# Patient Record
Sex: Male | Born: 1970 | Race: Black or African American | Hispanic: No | Marital: Married | Smoking: Current every day smoker
Health system: Southern US, Community
[De-identification: ages and names within clinical notes are randomized; demographics above are authoritative.]

## PROBLEM LIST (undated history)

## (undated) DIAGNOSIS — I10 Essential (primary) hypertension: Secondary | ICD-10-CM

## (undated) HISTORY — PX: APPENDECTOMY: SHX54

---

## 2005-01-31 ENCOUNTER — Emergency Department (HOSPITAL_COMMUNITY): Admission: EM | Admit: 2005-01-31 | Discharge: 2005-01-31 | Payer: Self-pay | Admitting: Family Medicine

## 2005-02-02 ENCOUNTER — Emergency Department (HOSPITAL_COMMUNITY): Admission: EM | Admit: 2005-02-02 | Discharge: 2005-02-02 | Payer: Self-pay | Admitting: Family Medicine

## 2010-11-27 ENCOUNTER — Emergency Department (HOSPITAL_COMMUNITY)
Admission: EM | Admit: 2010-11-27 | Discharge: 2010-11-27 | Disposition: A | Discharge status: Other Acute Inpatient Hospital | Payer: Self-pay | Source: Home / Self Care | Admitting: Family Medicine

## 2010-11-27 ENCOUNTER — Emergency Department (HOSPITAL_COMMUNITY)
Admission: EM | Admit: 2010-11-27 | Discharge: 2010-11-27 | Payer: Self-pay | Source: Home / Self Care | Admitting: Emergency Medicine

## 2010-11-27 LAB — POCT URINALYSIS DIPSTICK
Bilirubin Urine: NEGATIVE
Hgb urine dipstick: NEGATIVE
Specific Gravity, Urine: 1.025 (ref 1.005–1.030)
Urine Glucose, Fasting: NEGATIVE mg/dL

## 2012-12-17 ENCOUNTER — Emergency Department (INDEPENDENT_AMBULATORY_CARE_PROVIDER_SITE_OTHER)
Admission: EM | Admit: 2012-12-17 | Discharge: 2012-12-17 | Disposition: A | Discharge status: Home / Self Care | Payer: BC Managed Care – PPO | Source: Home / Self Care | Attending: Family Medicine | Admitting: Family Medicine

## 2012-12-17 ENCOUNTER — Encounter (HOSPITAL_COMMUNITY): Payer: Self-pay | Admitting: Emergency Medicine

## 2012-12-17 ENCOUNTER — Other Ambulatory Visit (HOSPITAL_COMMUNITY)
Admission: RE | Admit: 2012-12-17 | Discharge: 2012-12-17 | Disposition: A | Discharge status: Home / Self Care | Payer: BC Managed Care – PPO | Source: Ambulatory Visit | Attending: Family Medicine | Admitting: Family Medicine

## 2012-12-17 DIAGNOSIS — Z113 Encounter for screening for infections with a predominantly sexual mode of transmission: Secondary | ICD-10-CM | POA: Insufficient documentation

## 2012-12-17 DIAGNOSIS — Z202 Contact with and (suspected) exposure to infections with a predominantly sexual mode of transmission: Secondary | ICD-10-CM

## 2012-12-17 HISTORY — DX: Essential (primary) hypertension: I10

## 2012-12-17 NOTE — ED Provider Notes (Signed)
History     CSN: [id number]  Arrival date & time 12/17/12  1212   First MD Initiated Contact with Patient 12/17/12 1224      Chief Complaint  Patient presents with  . Exposure to STD    (Consider location/radiation/quality/duration/timing/severity/associated sxs/prior treatment) HPI Comments: 42 year old male smoker. Comes requesting STD check. Patient stated he had unprotected sex during the last week and he is not sure about his current sexual partner having any symptoms. He "just want to make sure is not infected with an STD"  he denies dysuria or discharge from penis. No rash. No malaise. No fever or chills. Denies prior history of STDs.   Past Medical History  Diagnosis Date  . Hypertension     History reviewed. No pertinent past surgical history.  No family history on file.  History  Substance Use Topics  . Smoking status: Current Every Day Smoker  . Smokeless tobacco: Not on file  . Alcohol Use: Yes      Review of Systems  Constitutional: Negative for fever and chills.  HENT: Negative for sore throat.   Eyes: Negative for redness.  Genitourinary: Negative for dysuria, frequency, discharge, genital sores and testicular pain.  Musculoskeletal: Negative for arthralgias.  Skin: Negative for rash.    Allergies  Review of patient's allergies indicates no known allergies.  Home Medications  No current outpatient prescriptions on file.  BP 152/93  Pulse 68  Temp(Src) 97.1 F (36.2 C) (Oral)  Resp 18  SpO2 97%  Physical Exam  Vitals reviewed. Constitutional: He is oriented to person, place, and time. He appears well-developed and well-nourished. No distress.  HENT:  Head: Normocephalic and atraumatic.  Mouth/Throat: Oropharynx is clear and moist.  Eyes: Conjunctivae are normal. No scleral icterus.  Neck: Neck supple.  Cardiovascular: Normal heart sounds.   Pulmonary/Chest: Breath sounds normal.  Abdominal: Soft. He exhibits no mass. There is no  tenderness.  Genitourinary:  deferred  Lymphadenopathy:    He has no cervical adenopathy.  Neurological: He is alert and oriented to person, place, and time.  Skin: No rash noted. He is not diaphoretic.    ED Course  Procedures (including critical care time)  Labs Reviewed  URINE CYTOLOGY ANCILLARY ONLY   No results found.   1. Exposure to STD       MDM  GC, Chlamydia and Trichomonas urine test result pending at the time of discharge. Patient declined STD blood tests. Recommended consistent condom use.      Sharin Grave, MD 12/20/12 [phone]

## 2012-12-17 NOTE — ED Notes (Signed)
Pt is here for STD check up He is asymptomatic; denies any medical prob  He is alert w/no signs of acute distress.

## 2014-12-27 ENCOUNTER — Encounter (HOSPITAL_COMMUNITY): Payer: Self-pay | Admitting: Emergency Medicine

## 2014-12-27 ENCOUNTER — Emergency Department (INDEPENDENT_AMBULATORY_CARE_PROVIDER_SITE_OTHER)
Admission: EM | Admit: 2014-12-27 | Discharge: 2014-12-27 | Disposition: A | Discharge status: Home / Self Care | Payer: BLUE CROSS/BLUE SHIELD | Source: Home / Self Care | Attending: Family Medicine | Admitting: Family Medicine

## 2014-12-27 DIAGNOSIS — I10 Essential (primary) hypertension: Secondary | ICD-10-CM

## 2014-12-27 DIAGNOSIS — M7071 Other bursitis of hip, right hip: Secondary | ICD-10-CM

## 2014-12-27 MED ORDER — LISINOPRIL 20 MG PO TABS: 20.0000 mg | ORAL_TABLET | Freq: Every day | ORAL | Status: AC

## 2014-12-27 MED ORDER — DICLOFENAC SODIUM 1 % TD GEL: 4.0000 g | Freq: Four times a day (QID) | TRANSDERMAL | Status: DC

## 2014-12-27 NOTE — Discharge Instructions (Signed)
Ice and gel on hip as prescribed, take bp medicine and see your doctor in 10 days for recheck.

## 2014-12-27 NOTE — ED Provider Notes (Signed)
CSN: [id number]     Arrival date & time 12/27/14  1706 History   None    Chief Complaint  Patient presents with  . Leg Pain  . Hip Pain   (Consider location/radiation/quality/duration/timing/severity/associated sxs/prior Treatment) Patient is a 44 y.o. male presenting with hip pain. The history is provided by the patient.  Hip Pain This is a new problem. The current episode started more than 1 week ago (does a lot of sitting for prolonged time, carries wallet in right rear pocket., no radicular pain.). The problem has not changed since onset.   Past Medical History  Diagnosis Date  . Hypertension    History reviewed. No pertinent past surgical history. History reviewed. No pertinent family history. History  Substance Use Topics  . Smoking status: Current Every Day Smoker  . Smokeless tobacco: Not on file  . Alcohol Use: Yes    Review of Systems  Constitutional: Negative.   Gastrointestinal: Negative.   Musculoskeletal: Negative for myalgias, back pain, joint swelling and gait problem.  Skin: Negative.     Allergies  Review of patient's allergies indicates no known allergies.  Home Medications   Prior to Admission medications   Medication Sig Start Date End Date Taking? Authorizing Provider  diclofenac sodium (VOLTAREN) 1 % GEL Apply 4 g topically 4 (four) times daily. 12/27/14   Linna HoffJames D Kendel Pesnell, MD  lisinopril (PRINIVIL,ZESTRIL) 20 MG tablet Take 1 tablet (20 mg total) by mouth daily. 12/27/14   Linna HoffJames D Paizley Ramella, MD   BP 193/97 mmHg  Pulse 63  Temp(Src) 98 F (36.7 C) (Oral)  Resp 18  SpO2 100% Physical Exam  Constitutional: He is oriented to person, place, and time. He appears well-developed and well-nourished.  Musculoskeletal: He exhibits tenderness.       Arms: Neurological: He is alert and oriented to person, place, and time.  Skin: Skin is warm and dry.  Nursing note and vitals reviewed.   ED Course  Procedures (including critical care time) Labs  Review Labs Reviewed - No data to display  Imaging Review No results found.   MDM   1. Bursitis, hip, right   2. Essential hypertension       Linna HoffJames D Alina Gilkey, MD 12/27/14 Windell [id number]

## 2014-12-27 NOTE — ED Notes (Signed)
Pt states that his right hip and leg have been hurting for over 1.5 weeks.

## 2015-02-09 ENCOUNTER — Other Ambulatory Visit: Payer: Self-pay | Admitting: Nurse Practitioner

## 2015-02-09 DIAGNOSIS — E049 Nontoxic goiter, unspecified: Secondary | ICD-10-CM

## 2015-02-18 ENCOUNTER — Emergency Department (INDEPENDENT_AMBULATORY_CARE_PROVIDER_SITE_OTHER)
Admission: EM | Admit: 2015-02-18 | Discharge: 2015-02-18 | Disposition: A | Discharge status: Home / Self Care | Payer: BLUE CROSS/BLUE SHIELD | Source: Home / Self Care | Attending: Family Medicine | Admitting: Family Medicine

## 2015-02-18 ENCOUNTER — Emergency Department (INDEPENDENT_AMBULATORY_CARE_PROVIDER_SITE_OTHER): Payer: BLUE CROSS/BLUE SHIELD

## 2015-02-18 ENCOUNTER — Encounter (HOSPITAL_COMMUNITY): Payer: Self-pay | Admitting: *Deleted

## 2015-02-18 DIAGNOSIS — M5489 Other dorsalgia: Secondary | ICD-10-CM | POA: Diagnosis not present

## 2015-02-18 DIAGNOSIS — M549 Dorsalgia, unspecified: Secondary | ICD-10-CM

## 2015-02-18 MED ORDER — METHOCARBAMOL 500 MG PO TABS: 500.0000 mg | ORAL_TABLET | Freq: Three times a day (TID) | ORAL | Status: DC

## 2015-02-18 NOTE — ED Provider Notes (Signed)
CSN: [id number]     Arrival date & time 02/18/15  1309 History   First MD Initiated Contact with Patient 02/18/15 1321     Chief Complaint  Patient presents with  . Back Pain   (Consider location/radiation/quality/duration/timing/severity/associated sxs/prior Treatment) Patient is a 44 y.o. male presenting with back pain. The history is provided by the patient and the spouse.  Back Pain Location:  Thoracic spine Quality:  Stiffness Pain severity:  Mild Duration:  2 weeks Progression:  Unchanged Chronicity:  New Context: physical stress   Context: not recent illness and not recent injury     Past Medical History  Diagnosis Date  . Hypertension    History reviewed. No pertinent past surgical history. History reviewed. No pertinent family history. History  Substance Use Topics  . Smoking status: Current Every Day Smoker  . Smokeless tobacco: Not on file  . Alcohol Use: Yes    Review of Systems  Constitutional: Negative.   Musculoskeletal: Positive for myalgias and back pain. Negative for gait problem.    Allergies  Review of patient's allergies indicates no known allergies.  Home Medications   Prior to Admission medications   Medication Sig Start Date End Date Taking? Authorizing Provider  diclofenac sodium (VOLTAREN) 1 % GEL Apply 4 g topically 4 (four) times daily. 12/27/14   Linna HoffJames D Kindl, MD  lisinopril (PRINIVIL,ZESTRIL) 20 MG tablet Take 1 tablet (20 mg total) by mouth daily. 12/27/14   Linna HoffJames D Kindl, MD  methocarbamol (ROBAXIN) 500 MG tablet Take 1 tablet (500 mg total) by mouth 3 (three) times daily. As muscle relaxer 02/18/15   Linna HoffJames D Kindl, MD   BP 149/85 mmHg  Pulse 66  Temp(Src) 99.1 F (37.3 C) (Oral)  Resp 18  SpO2 100% Physical Exam  Constitutional: He is oriented to person, place, and time. He appears well-developed and well-nourished.  Neck: Normal range of motion. Neck supple.  Musculoskeletal: He exhibits tenderness.        Arms: Lymphadenopathy:    He has no cervical adenopathy.  Neurological: He is alert and oriented to person, place, and time.  Skin: Skin is warm and dry.  Nursing note and vitals reviewed.   ED Course  Procedures (including critical care time) Labs Review Labs Reviewed - No data to display  Imaging Review Dg Scoliosis Eval Complete Spine 4 Or 5 Views  02/18/2015   CLINICAL DATA:  Back pain.  Scoliosis.  EXAM: DG SCOLIOSIS EVAL COMPLETE SPINE 4-5V  COMPARISON:  None.  FINDINGS: Normal alignment of the thoracic and lumbar spines. No scoliosis. Disc spaces and vertebral bodies are maintained.  IMPRESSION: Normal thoracolumbar series.  No measurable scoliosis.   Electronically Signed   By: Rudie MeyerP.  Gallerani M.D.   On: 02/18/2015 13:59    X-rays reviewed and report per radiologist.  MDM   1. Midline back pain, unspecified location   2. Back pain        Linna HoffJames D Kindl, MD 02/18/15 [phone]

## 2015-02-18 NOTE — Discharge Instructions (Signed)
Heat and medicine as needed, see orthopedist if further problems °

## 2015-02-18 NOTE — ED Notes (Signed)
Pt  Reports  Upper  r   Back pain   Worse on  Movements   -  denys  Any  specefic  Injury     sitting  Upright on the  Exam table  Speaking in  Complete  sentances  Appearing in no  Severe  Distress

## 2015-04-12 ENCOUNTER — Other Ambulatory Visit (HOSPITAL_COMMUNITY): Payer: Self-pay | Admitting: Endocrinology

## 2015-04-12 DIAGNOSIS — E049 Nontoxic goiter, unspecified: Secondary | ICD-10-CM

## 2015-04-12 DIAGNOSIS — E0590 Thyrotoxicosis, unspecified without thyrotoxic crisis or storm: Secondary | ICD-10-CM

## 2015-04-26 ENCOUNTER — Ambulatory Visit (HOSPITAL_COMMUNITY): Payer: BLUE CROSS/BLUE SHIELD

## 2015-04-27 ENCOUNTER — Other Ambulatory Visit (HOSPITAL_COMMUNITY): Payer: BLUE CROSS/BLUE SHIELD

## 2015-05-03 ENCOUNTER — Ambulatory Visit (HOSPITAL_COMMUNITY)
Admission: RE | Admit: 2015-05-03 | Discharge: 2015-05-03 | Disposition: A | Discharge status: Home / Self Care | Payer: BLUE CROSS/BLUE SHIELD | Source: Ambulatory Visit | Attending: Endocrinology | Admitting: Endocrinology

## 2015-05-03 DIAGNOSIS — E049 Nontoxic goiter, unspecified: Secondary | ICD-10-CM

## 2015-05-03 DIAGNOSIS — E0500 Thyrotoxicosis with diffuse goiter without thyrotoxic crisis or storm: Secondary | ICD-10-CM | POA: Diagnosis present

## 2015-05-03 DIAGNOSIS — E0590 Thyrotoxicosis, unspecified without thyrotoxic crisis or storm: Secondary | ICD-10-CM

## 2015-05-04 ENCOUNTER — Encounter (HOSPITAL_COMMUNITY)
Admission: RE | Admit: 2015-05-04 | Discharge: 2015-05-04 | Disposition: A | Discharge status: Home / Self Care | Payer: BLUE CROSS/BLUE SHIELD | Source: Ambulatory Visit | Attending: Endocrinology | Admitting: Endocrinology

## 2015-05-04 DIAGNOSIS — E0500 Thyrotoxicosis with diffuse goiter without thyrotoxic crisis or storm: Secondary | ICD-10-CM | POA: Diagnosis not present

## 2015-05-04 MED ORDER — SODIUM PERTECHNETATE TC 99M INJECTION: 10.9000 | Freq: Once | INTRAVENOUS | Status: AC | PRN

## 2015-05-04 MED ORDER — SODIUM IODIDE I 131 CAPSULE: 6.5000 | Freq: Once | INTRAVENOUS | Status: AC | PRN

## 2020-03-18 ENCOUNTER — Other Ambulatory Visit: Payer: Self-pay

## 2020-03-18 ENCOUNTER — Ambulatory Visit
Admission: EM | Admit: 2020-03-18 | Discharge: 2020-03-18 | Disposition: A | Discharge status: Home / Self Care | Payer: Managed Care, Other (non HMO) | Attending: Physician Assistant | Admitting: Physician Assistant

## 2020-03-18 DIAGNOSIS — K5900 Constipation, unspecified: Secondary | ICD-10-CM

## 2020-03-18 DIAGNOSIS — R1031 Right lower quadrant pain: Secondary | ICD-10-CM

## 2020-03-18 DIAGNOSIS — Z9049 Acquired absence of other specified parts of digestive tract: Secondary | ICD-10-CM

## 2020-03-18 MED ORDER — POLYETHYLENE GLYCOL 3350 17 G PO PACK: 17.0000 g | PACK | Freq: Every day | ORAL | 0 refills | Status: AC

## 2020-03-18 NOTE — Discharge Instructions (Signed)
No alarming signs on exam. Start miralax as directed. Keep hydrated, urine should be clear to pale yellow in color. Follow up with PCP if symptoms not improving. If having worsening abdominal pain, nausea/vomiting, not passing gas, go to the ED for further evaluation.

## 2020-03-18 NOTE — ED Provider Notes (Signed)
[facility]    CSN: [id number] Arrival date & time: 03/18/20  0909      History   Chief Complaint Chief Complaint  Patient presents with  . Abdominal Pain    HPI Clinton Walter is a 49 y.o. male.   49 year old male comes in for 2 day history of RLQ pain. Pain is intermittent, states feels constipated, with pressure sensation. Pain depending on certain movement. Denies nausea, vomiting. Denies fever, chills, body aches. Denies urinary symptoms. Denies testicular swelling/pain. Last BM yesterday without much straining, but with small hard stools. S/p appendectomy 1993     Past Medical History:  Diagnosis Date  . Hypertension     There are no problems to display for this patient.   Past Surgical History:  Procedure Laterality Date  . APPENDECTOMY         Home Medications    Prior to Admission medications   Medication Sig Start Date End Date Taking? Authorizing Provider  lisinopril (PRINIVIL,ZESTRIL) 20 MG tablet Take 1 tablet (20 mg total) by mouth daily. 12/27/14   Linna Hoff, MD  polyethylene glycol (MIRALAX) 17 g packet Take 17 g by mouth daily. 03/18/20   Belinda Fisher, PA-C    Family History History reviewed. No pertinent family history.  Social History Social History   Tobacco Use  . Smoking status: Current Every Day Smoker  . Smokeless tobacco: Never Used  Substance Use Topics  . Alcohol use: Yes  . Drug use: Yes    Types: Marijuana     Allergies   Patient has no known allergies.   Review of Systems Review of Systems  Reason unable to perform ROS: See HPI as above.     Physical Exam Triage Vital Signs ED Triage Vitals  Enc Vitals Group     BP 03/18/20 0915 (!) 184/97     Pulse Rate 03/18/20 0915 72     Resp 03/18/20 0915 20     Temp 03/18/20 0915 98.5 F (36.9 C)     Temp Source 03/18/20 0915 Oral     SpO2 03/18/20 0915 97 %     Weight --      Height --      Head Circumference --      Peak Flow --      Pain  Score 03/18/20 0920 5     Pain Loc --      Pain Edu? --      Excl. in GC? --    No data found.  Updated Vital Signs BP (!) 184/97 (BP Location: Left Arm)   Pulse 72   Temp 98.5 F (36.9 C) (Oral)   Resp 20   SpO2 97%   Physical Exam Constitutional:      General: He is not in acute distress.    Appearance: Normal appearance. He is not ill-appearing, toxic-appearing or diaphoretic.  HENT:     Head: Normocephalic and atraumatic.  Cardiovascular:     Rate and Rhythm: Normal rate and regular rhythm.  Pulmonary:     Effort: Pulmonary effort is normal. No respiratory distress.     Comments: LCTAB Abdominal:     General: Bowel sounds are normal.     Palpations: Abdomen is soft.     Tenderness: There is no abdominal tenderness. There is no right CVA tenderness, left CVA tenderness, guarding or rebound.     Hernia: No hernia is present.     Comments: Linear scar across RLQ from  prior appendectomy.   Musculoskeletal:     Cervical back: Normal range of motion and neck supple.  Skin:    General: Skin is warm and dry.  Neurological:     Mental Status: He is alert and oriented to person, place, and time.      UC Treatments / Results  Labs (all labs ordered are listed, but only abnormal results are displayed) Labs Reviewed - No data to display  EKG   Radiology No results found.  Procedures Procedures (including critical care time)  Medications Ordered in UC Medications - No data to display  Initial Impression / Assessment and Plan / UC Course  I have reviewed the triage vital signs and the nursing notes.  Pertinent labs & imaging results that were available during my care of the patient were reviewed by me and considered in my medical decision making (see chart for details).    No alarming signs on exam. Will treat for constipation that is possibly causing symptoms. miralax as directed. Return precautions given. Patient expresses understanding and agrees to plan.   Final Clinical Impressions(s) / UC Diagnoses   Final diagnoses:  RLQ abdominal pain  Constipation, unspecified constipation type  S/P appendectomy    ED Prescriptions    Medication Sig Dispense Auth. Provider   polyethylene glycol (MIRALAX) 17 g packet Take 17 g by mouth daily. 14 each Ok Edwards, PA-C     PDMP not reviewed this encounter.   Ok Edwards, PA-C 03/18/20 1112

## 2020-03-18 NOTE — ED Triage Notes (Signed)
Clinton Walter is here with abdominal pain that he states started yesterday shortly after waking up and has continued off/on. He notes pain in the lower right abdomen (he has had an appendectomy).

## 2021-11-15 ENCOUNTER — Encounter (HOSPITAL_BASED_OUTPATIENT_CLINIC_OR_DEPARTMENT_OTHER): Payer: Self-pay | Admitting: Emergency Medicine

## 2021-11-15 ENCOUNTER — Emergency Department (HOSPITAL_BASED_OUTPATIENT_CLINIC_OR_DEPARTMENT_OTHER)
Admission: EM | Admit: 2021-11-15 | Discharge: 2021-11-15 | Disposition: A | Discharge status: Home / Self Care | Payer: Managed Care, Other (non HMO) | Attending: Emergency Medicine | Admitting: Emergency Medicine

## 2021-11-15 ENCOUNTER — Other Ambulatory Visit: Payer: Self-pay

## 2021-11-15 ENCOUNTER — Ambulatory Visit
Admission: EM | Admit: 2021-11-15 | Discharge: 2021-11-15 | Disposition: A | Discharge status: Home / Self Care | Payer: Managed Care, Other (non HMO)

## 2021-11-15 ENCOUNTER — Emergency Department (HOSPITAL_BASED_OUTPATIENT_CLINIC_OR_DEPARTMENT_OTHER): Payer: Managed Care, Other (non HMO) | Admitting: Radiology

## 2021-11-15 DIAGNOSIS — I161 Hypertensive emergency: Secondary | ICD-10-CM | POA: Diagnosis not present

## 2021-11-15 DIAGNOSIS — M25512 Pain in left shoulder: Secondary | ICD-10-CM

## 2021-11-15 DIAGNOSIS — X500XXA Overexertion from strenuous movement or load, initial encounter: Secondary | ICD-10-CM | POA: Diagnosis not present

## 2021-11-15 DIAGNOSIS — S299XXA Unspecified injury of thorax, initial encounter: Secondary | ICD-10-CM | POA: Diagnosis present

## 2021-11-15 DIAGNOSIS — S29011A Strain of muscle and tendon of front wall of thorax, initial encounter: Secondary | ICD-10-CM | POA: Insufficient documentation

## 2021-11-15 DIAGNOSIS — I10 Essential (primary) hypertension: Secondary | ICD-10-CM | POA: Diagnosis not present

## 2021-11-15 DIAGNOSIS — Z79899 Other long term (current) drug therapy: Secondary | ICD-10-CM | POA: Insufficient documentation

## 2021-11-15 DIAGNOSIS — M25571 Pain in right ankle and joints of right foot: Secondary | ICD-10-CM

## 2021-11-15 DIAGNOSIS — M24811 Other specific joint derangements of right shoulder, not elsewhere classified: Secondary | ICD-10-CM | POA: Diagnosis not present

## 2021-11-15 DIAGNOSIS — M25572 Pain in left ankle and joints of left foot: Secondary | ICD-10-CM

## 2021-11-15 DIAGNOSIS — M25511 Pain in right shoulder: Secondary | ICD-10-CM

## 2021-11-15 LAB — URINALYSIS, ROUTINE W REFLEX MICROSCOPIC
Bilirubin Urine: NEGATIVE
Glucose, UA: NEGATIVE mg/dL
Hgb urine dipstick: NEGATIVE
Ketones, ur: NEGATIVE mg/dL
Leukocytes,Ua: NEGATIVE
Nitrite: NEGATIVE
Protein, ur: 30 mg/dL — AB
Specific Gravity, Urine: 1.025 (ref 1.005–1.030)
pH: 5.5 (ref 5.0–8.0)

## 2021-11-15 LAB — CBC
HCT: 38.6 % — ABNORMAL LOW (ref 39.0–52.0)
Hemoglobin: 11.9 g/dL — ABNORMAL LOW (ref 13.0–17.0)
MCH: 25 pg — ABNORMAL LOW (ref 26.0–34.0)
MCHC: 30.8 g/dL (ref 30.0–36.0)
MCV: 81.1 fL (ref 80.0–100.0)
Platelets: 254 10*3/uL (ref 150–400)
RBC: 4.76 MIL/uL (ref 4.22–5.81)
RDW: 15.9 % — ABNORMAL HIGH (ref 11.5–15.5)
WBC: 8.6 10*3/uL (ref 4.0–10.5)
nRBC: 0 % (ref 0.0–0.2)

## 2021-11-15 LAB — BASIC METABOLIC PANEL
Anion gap: 6 (ref 5–15)
BUN: 12 mg/dL (ref 6–20)
CO2: 28 mmol/L (ref 22–32)
Calcium: 9.5 mg/dL (ref 8.9–10.3)
Chloride: 103 mmol/L (ref 98–111)
Creatinine, Ser: 0.9 mg/dL (ref 0.61–1.24)
GFR, Estimated: >60 mL/min (ref >60)
Glucose, Bld: 108 mg/dL — ABNORMAL HIGH (ref 70–99)
Potassium: 3.7 mmol/L (ref 3.5–5.1)
Sodium: 137 mmol/L (ref 135–145)

## 2021-11-15 LAB — TROPONIN I (HIGH SENSITIVITY): Troponin I (High Sensitivity): 16 ng/L (ref <18)

## 2021-11-15 MED ORDER — METHYLPREDNISOLONE 4 MG PO TBPK: ORAL_TABLET | ORAL | 0 refills | Status: AC

## 2021-11-15 MED ORDER — METHOCARBAMOL 500 MG PO TABS
750.0000 mg | ORAL_TABLET | Freq: Once | ORAL | Status: AC
  Administered 2021-11-15: 750 mg via ORAL
  Filled 2021-11-15: qty 2

## 2021-11-15 MED ORDER — ACETAMINOPHEN 500 MG PO TABS
1000.0000 mg | ORAL_TABLET | Freq: Once | ORAL | Status: AC
  Administered 2021-11-15: 1000 mg via ORAL
  Filled 2021-11-15: qty 2

## 2021-11-15 MED ORDER — METHOCARBAMOL 750 MG PO TABS: 750.0000 mg | ORAL_TABLET | Freq: Four times a day (QID) | ORAL | 0 refills | Status: AC | PRN

## 2021-11-15 MED ORDER — AMLODIPINE BESYLATE 5 MG PO TABS: 5.0000 mg | ORAL_TABLET | Freq: Every day | ORAL | 0 refills | Status: DC

## 2021-11-15 NOTE — Discharge Instructions (Signed)
Please go to the hospital as soon as you leave urgent care for further evaluation and management. 

## 2021-11-15 NOTE — Discharge Instructions (Addendum)
1.  The most important thing for you to address is your blood pressure.  Your instructions in your discharge instructions regarding diet and exercise to help with blood pressure maintenance.  Also quitting smoking is very important. You have been prescribed amlodipine to start for blood pressure control.  Being started at a 5 mg daily dose.  If after 1 week of taking the medication, your blood pressures remain over 140 on your upper number, take 10 mg daily.  Is very important that you schedule follow-up with your primary care physician to monitor your blood pressure and help you achieve blood pressure control with lifestyle modification and medications. 2.  You appear to have a muscle tear.  You may also have a tear or strain in the rotator cuff or other surrounding supporting structures of the shoulder.  For your symptoms you are being prescribed a steroid pack.  Take per package instructions.  For pain control you may take extra strength Tylenol every 6 hours.  You have also been prescribed a muscle relaxer.  Start with small range of motion exercises as illustrated in the emergency department. 3.  Return to emergency department immediately if you have worsening or concerning symptoms such as headache, visual changes, chest pain, shortness of breath or other concerns

## 2021-11-15 NOTE — ED Provider Notes (Signed)
[facility]    CSN: [id number] Arrival date & time: 11/15/21  6283      History   Chief Complaint Chief Complaint  Patient presents with   shoulder pains    HPI Clinton Walter is a 51 y.o. male.   Patient presents with bilateral shoulder pains, right chest pain, bilateral ankle pain that has been present for approximately 5 days.  Patient denies any apparent injury but thinks that he may have pulled some muscles causing the pain.  Pain is worsened with movement of the shoulder.  The right shoulder pain is worse than the left.  He reports that the chest pain occurs in the right chest and radiates down the right arm.  Also having some associated numbness and tingling in his arms.  Bilateral ankle pain is intermittent and mainly occurs with movement.  Denies any numbness or tingling in the ankles.  Denies any swelling in the ankles.  Patient has elevated blood pressure reading in urgent care today.  He denies shortness of breath, headache, nausea, vomiting, dizziness, blurred vision.  He reports that he used to take lisinopril approximately 1 year ago but stopped taking after he ran out of the medication after losing his primary care doctor.    Past Medical History:  Diagnosis Date   Hypertension     There are no problems to display for this patient.   Past Surgical History:  Procedure Laterality Date   APPENDECTOMY         Home Medications    Prior to Admission medications   Medication Sig Start Date End Date Taking? Authorizing Provider  lisinopril (PRINIVIL,ZESTRIL) 20 MG tablet Take 1 tablet (20 mg total) by mouth daily. 12/27/14   Linna Hoff, MD  polyethylene glycol (MIRALAX) 17 g packet Take 17 g by mouth daily. 03/18/20   Belinda Fisher, PA-C    Family History History reviewed. No pertinent family history.  Social History Social History   Tobacco Use   Smoking status: Every Day   Smokeless tobacco: Never  Substance Use Topics   Alcohol use:  Yes   Drug use: Yes    Types: Marijuana     Allergies   Patient has no known allergies.   Review of Systems Review of Systems Per HPI  Physical Exam Triage Vital Signs ED Triage Vitals  Enc Vitals Group     BP 11/15/21 0821 (!) 208/110     Pulse Rate 11/15/21 0821 77     Resp 11/15/21 0821 18     Temp 11/15/21 0821 98.8 F (37.1 C)     Temp Source 11/15/21 0821 Oral     SpO2 11/15/21 0821 97 %     Weight --      Height --      Head Circumference --      Peak Flow --      Pain Score 11/15/21 0822 0     Pain Loc --      Pain Edu? --      Excl. in GC? --    No data found.  Updated Vital Signs BP (!) 197/85    Pulse 77    Temp 98.8 F (37.1 C) (Oral)    Resp 18    SpO2 97%   Visual Acuity Right Eye Distance:   Left Eye Distance:   Bilateral Distance:    Right Eye Near:   Left Eye Near:    Bilateral Near:     Physical Exam  Constitutional:      General: He is not in acute distress.    Appearance: Normal appearance. He is not toxic-appearing or diaphoretic.  HENT:     Head: Normocephalic and atraumatic.  Eyes:     Extraocular Movements: Extraocular movements intact.     Conjunctiva/sclera: Conjunctivae normal.  Cardiovascular:     Rate and Rhythm: Normal rate and regular rhythm.     Pulses: Normal pulses.     Heart sounds: Normal heart sounds.  Pulmonary:     Effort: Pulmonary effort is normal. No respiratory distress.     Breath sounds: Normal breath sounds.  Musculoskeletal:     Comments: No tenderness to palpation to entirety of chest or bilateral shoulders.  Pain does occur with range of motion of right shoulder to anterior shoulder.  Neurovascular intact to bilateral upper extremities.  Grip strength 5/5.  No tenderness to palpation to bilateral ankles.  No obvious swelling noted.  No erythema noted.  Neurovascular intact to bilateral lower extremities.  Full range of motion present.  Neurological:     General: No focal deficit present.     Mental  Status: He is alert and oriented to person, place, and time. Mental status is at baseline.     Cranial Nerves: Cranial nerves 2-12 are intact.     Sensory: Sensation is intact.     Motor: Motor function is intact.     Coordination: Coordination is intact.     Gait: Gait is intact.  Psychiatric:        Mood and Affect: Mood normal.        Behavior: Behavior normal.        Thought Content: Thought content normal.        Judgment: Judgment normal.     UC Treatments / Results  Labs (all labs ordered are listed, but only abnormal results are displayed) Labs Reviewed - No data to display  EKG   Radiology No results found.  Procedures Procedures (including critical care time)  Medications Ordered in UC Medications - No data to display  Initial Impression / Assessment and Plan / UC Course  I have reviewed the triage vital signs and the nursing notes.  Pertinent labs & imaging results that were available during my care of the patient were reviewed by me and considered in my medical decision making (see chart for details).     Patient has bilateral shoulder pain, right chest pain, bilateral ankle pain that appears musculoskeletal in nature.  Discussed supportive care and RICE with patient.  Do not think that imaging is necessary at this time due to physical exam and no apparent injury.  Patient has a significantly elevated blood pressure in urgent care today warranting more extensive evaluation than can be provided at the urgent care.  It also appears the patient will need to get control of his blood pressure today and I do not have any medications in urgent care to do this.  I also do not think that a prescription for blood pressure medication will obtain the decrease in blood pressure that would be needed due to how high the blood pressure is at this time.  Advised patient that he will need to go the hospital for further evaluation and management of high blood pressure.  Patient  verbalized understanding and was agreeable with plan.  Patient left via his family member transporting him to the hospital. Final Clinical Impressions(s) / UC Diagnoses   Final diagnoses:  Hypertensive emergency  Acute  pain of both shoulders  Acute bilateral ankle pain     Discharge Instructions      Please go to the hospital as soon as you leave urgent care for further evaluation and management.    ED Prescriptions   None    PDMP not reviewed this encounter.   Gustavus BryantMound, Cythia Bachtel E, [place]FNP 11/15/21 [phone]

## 2021-11-15 NOTE — ED Triage Notes (Signed)
Pt c/o not being able to lift arms states "it's real sore," also ankles are sore, and right chest is sore when lifting arms. Onset ~ Monday. States was "doing some heavy lifting."

## 2021-11-15 NOTE — ED Notes (Signed)
Patient transported to X-ray 

## 2021-11-15 NOTE — ED Triage Notes (Signed)
Pt seen at [facility] for right shoulder pain x3 days after lifting something heavy. Sent here for HTN. States he has not been taking HTN meds for a long time. Denies HA or blurred vision.

## 2021-11-15 NOTE — ED Provider Notes (Signed)
[facility] EMERGENCY DEPT Provider Note   CSN: [id number] Arrival date & time: 11/15/21  1448     History  Chief Complaint  Patient presents with   Hypertension    Clinton Walter is a 51 y.o. male.  HPI Patient reports he was lifting down a heavy bin on a shelf.  He reports when he pulled it off it turned out to be much heavier than he expected and he had to compensate to hold it with his right arm.  He reports at the time it was not exquisitely painful but by the next morning he had a lot of pain and stiffness in the right shoulder and in the pectoralis.  Since that time, pain has been progressive and he is having more difficulty if he raises his arm with severe pain into the top of the shoulder.  He reports in certain positions also he gets a sharp pain right on the far lateral side of the right chest.  No associated shortness of breath nausea lightheadedness or near syncope.  Patient has been getting relief of pain with combination of acetaminophen and ibuprofen.  Patient does not get any exertional chest pain or shortness of breath with exertion  Patient was seen in urgent care today and concern for hypertensive emergency based on elevated blood pressure plus chest and shoulder pain.  Patient reports he has not been taking Lotrel consistently for over a year.  He has not taken any blood pressure medicine for at least several weeks.  He reports he does not take it because he feels like he gets muscle cramps all over when he takes his blood pressure medicine.  He has been diagnosed with hypertension for several years and has had intermittent compliance.  Patient denies he gets any problems with headaches or blurred vision.  He does not experience exertional chest pain or shortness of breath.  He reports he smokes marijuana.  He does not smoke cigarettes.  Intermittent social alcohol use no consistent alcohol use.    Home Medications Prior to Admission medications    Medication Sig Start Date End Date Taking? Authorizing Provider  amLODipine (NORVASC) 5 MG tablet Take 1 tablet (5 mg total) by mouth daily. 11/15/21  Yes Arby Barrette, MD  methocarbamol (ROBAXIN-750) 750 MG tablet Take 1 tablet (750 mg total) by mouth every 6 (six) hours as needed for muscle spasms. 11/15/21  Yes Arby Barrette, MD  methylPREDNISolone (MEDROL DOSEPAK) 4 MG TBPK tablet Take per Dosepak instruction 11/15/21  Yes Alajah Witman, Lebron Conners, MD  lisinopril (PRINIVIL,ZESTRIL) 20 MG tablet Take 1 tablet (20 mg total) by mouth daily. 12/27/14   Linna Hoff, MD  polyethylene glycol (MIRALAX) 17 g packet Take 17 g by mouth daily. 03/18/20   Belinda Fisher, PA-C      Allergies    Patient has no known allergies.    Review of Systems   Review of Systems 10 systems reviewed negative except as per HPI Physical Exam Updated Vital Signs BP (!) 170/98    Pulse (!) 54    Temp 98.4 F (36.9 C) (Oral)    Resp (!) 21    Ht 6' (1.829 m)    Wt 108.9 kg    SpO2 99%    BMI 32.55 kg/m  Physical Exam Constitutional:      Appearance: Normal appearance.     Comments: Alert nontoxic well in appearance.  No respiratory distress.  HENT:     Mouth/Throat:     Pharynx:  Oropharynx is clear.  Eyes:     Extraocular Movements: Extraocular movements intact.  Cardiovascular:     Rate and Rhythm: Normal rate and regular rhythm.  Pulmonary:     Effort: Pulmonary effort is normal.     Breath sounds: Normal breath sounds.     Comments: Point tenderness on the far lateral pectoralis minor area.  Exacerbated by elevating the arm. Chest:     Chest wall: Tenderness present.  Abdominal:     General: There is no distension.     Palpations: Abdomen is soft.     Tenderness: There is no abdominal tenderness. There is no guarding.  Musculoskeletal:        General: No swelling.     Cervical back: Neck supple.     Right lower leg: No edema.     Left lower leg: No edema.     Comments: Significant pain with abducting  shoulder beyond about 75 degrees.  Focal pain over the point of the shoulder.  Arm is otherwise neurovascularly intact and normal.  Pulse 2+ and strong.  Hand warm and dry.  Grip strength 5/5  Skin:    General: Skin is warm and dry.  Neurological:     General: No focal deficit present.     Mental Status: He is alert and oriented to person, place, and time.     Motor: No weakness.     Coordination: Coordination normal.  Psychiatric:        Mood and Affect: Mood normal.    ED Results / Procedures / Treatments   Labs (all labs ordered are listed, but only abnormal results are displayed) Labs Reviewed  BASIC METABOLIC PANEL - Abnormal; Notable for the following components:      Result Value   Glucose, Bld 108 (*)    All other components within normal limits  CBC - Abnormal; Notable for the following components:   Hemoglobin 11.9 (*)    HCT 38.6 (*)    MCH 25.0 (*)    RDW 15.9 (*)    All other components within normal limits  URINALYSIS, ROUTINE W REFLEX MICROSCOPIC - Abnormal; Notable for the following components:   Protein, ur 30 (*)    All other components within normal limits  TROPONIN I (HIGH SENSITIVITY)    EKG EKG Interpretation  Date/Time:  Friday November 15 2021 09:26:21 EST Ventricular Rate:  70 PR Interval:  180 QRS Duration: 88 QT Interval:  404 QTC Calculation: 436 R Axis:   69 Text Interpretation: Sinus rhythm normal, no sig change from previous tracing Confirmed by Arby BarrettePfeiffer, Chiniqua Kilcrease [phone]([id number]) on 11/15/2021 9:28:32 AM  Radiology DG Chest 2 View  Result Date: 11/15/2021 CLINICAL DATA:  Chest pain for 3 days EXAM: CHEST - 2 VIEW COMPARISON:  Chest radiograph 11/27/2010 FINDINGS: Heart is mildly enlarged. The upper mediastinal contours are normal. There is no focal consolidation or pulmonary edema. There is irregular nodular opacity in the right apex measuring 1.7 cm projecting over the clavicle. There is no pleural effusion or pneumothorax. There is no acute osseous  abnormality. IMPRESSION: 1. 1.7 cm nodular opacity in the right apex may reflect overlapping structures; however, recommend nonemergent CT chest to evaluate for parenchymal nodule. 2. Mild cardiomegaly. Electronically Signed   By: Lesia HausenPeter  Noone M.D.   On: 11/15/2021 09:36    Procedures Procedures    Medications Ordered in ED Medications  methocarbamol (ROBAXIN) tablet 750 mg (750 mg Oral Given 11/15/21 0937)  acetaminophen (TYLENOL) tablet 1,000 mg (1,000  mg Oral Given 11/15/21 5188)    ED Course/ Medical Decision Making/ A&P                           Medical Decision Making EMR reviewed for additional medical history.  Complete history of present illness and physical exam as outlined.  Patient is referred to the emergency department from urgent care for concern of hypertension and shoulder pain.  Patient's right shoulder pain is consistent with musculoskeletal strain.  Patient had an inciting event and physical exam is for significantly reproducible by position and palpation.  Will treat pain in the emergency department with acetaminophen and Robaxin.  Regarding patient's hypertension and concern for hypertensive emergency, will obtain screening EKG, chest x-ray and labs for hypertension.  At this time patient does not have clinical findings of hypertensive emergency.  No emergent blood pressure management indicated at this time.  Patient has been noncompliant with medications for long time without symptoms.  Plan is to review diagnostic results and approach blood pressure management with the patient for optimizing outpatient care.  Diagnostic results reviewed by myself.  EKG chest x-ray and troponin do not show concerning changes.  Patient's physical exam is consistent with musculoskeletal strain.    Extensive counseling provided to the patient and his wife at bedside, regarding necessity of blood pressure management.  Detailed instructions included in discharge instructions for lifestyle  modification and follow-up plan.  Patient will be started on amlodipine daily.  Regarding the patient's muscle strain and suspected rotator cuff tear, patient is being given a Medrol Dosepak.  He is counseled on extra strength Tylenol and Robaxin for additional pain control.  Patient was also counseled on initiating small range of motion exercises to avoid adhesive capsulitis.  Counseled to follow-up first with his PCP to determine his progress and then referral to orthopedics if needed.  Final Clinical Impression(s) / ED Diagnoses Final diagnoses:  Essential hypertension  Internal derangement of shoulder, right  Pectoralis muscle strain, initial encounter    Rx / DC Orders ED Discharge Orders          Ordered    amLODipine (NORVASC) 5 MG tablet  Daily        11/15/21 1153    methocarbamol (ROBAXIN-750) 750 MG tablet  Every 6 hours PRN        11/15/21 1153    methylPREDNISolone (MEDROL DOSEPAK) 4 MG TBPK tablet        11/15/21 1153              Arby Barrette, MD 11/15/21 1159

## 2021-11-23 ENCOUNTER — Telehealth (HOSPITAL_COMMUNITY): Payer: Self-pay | Admitting: Emergency Medicine

## 2021-11-23 NOTE — Telephone Encounter (Signed)
Contact number in demographics used to leave a voicemail message.  Voicemail message detailed incidental finding on plain film chest x-ray with radiology recommendation for outpatient CT follow-up.  Patient advised of necessity to follow-up with a PCP to further evaluate.

## 2021-12-11 ENCOUNTER — Other Ambulatory Visit: Payer: Self-pay | Admitting: Internal Medicine

## 2021-12-11 DIAGNOSIS — R911 Solitary pulmonary nodule: Secondary | ICD-10-CM

## 2021-12-12 ENCOUNTER — Other Ambulatory Visit (HOSPITAL_BASED_OUTPATIENT_CLINIC_OR_DEPARTMENT_OTHER): Payer: Self-pay

## 2021-12-12 DIAGNOSIS — R0683 Snoring: Secondary | ICD-10-CM

## 2022-01-15 ENCOUNTER — Other Ambulatory Visit: Payer: Self-pay

## 2022-01-15 ENCOUNTER — Ambulatory Visit
Admission: RE | Admit: 2022-01-15 | Discharge: 2022-01-15 | Disposition: A | Discharge status: Home / Self Care | Payer: Managed Care, Other (non HMO) | Source: Ambulatory Visit | Attending: Internal Medicine | Admitting: Internal Medicine

## 2022-01-15 DIAGNOSIS — R911 Solitary pulmonary nodule: Secondary | ICD-10-CM

## 2022-01-20 ENCOUNTER — Other Ambulatory Visit (HOSPITAL_COMMUNITY): Payer: Self-pay | Admitting: Internal Medicine

## 2022-01-20 DIAGNOSIS — R918 Other nonspecific abnormal finding of lung field: Secondary | ICD-10-CM

## 2022-01-22 ENCOUNTER — Ambulatory Visit (HOSPITAL_BASED_OUTPATIENT_CLINIC_OR_DEPARTMENT_OTHER): Payer: Managed Care, Other (non HMO) | Attending: Internal Medicine | Admitting: Internal Medicine

## 2022-01-22 ENCOUNTER — Other Ambulatory Visit: Payer: Self-pay

## 2022-01-22 VITALS — Ht 72.0 in | Wt 239.0 lb

## 2022-01-22 DIAGNOSIS — G4733 Obstructive sleep apnea (adult) (pediatric): Secondary | ICD-10-CM | POA: Insufficient documentation

## 2022-01-22 DIAGNOSIS — R0683 Snoring: Secondary | ICD-10-CM

## 2022-02-01 DIAGNOSIS — R0683 Snoring: Secondary | ICD-10-CM

## 2022-02-01 NOTE — Procedures (Signed)
? ?  Patient Name: Clinton Walter, Clinton Walter ?Study Date: 01/22/2022 ?Gender: Male ?D.O.B: November 08, 1970 ?Age (years): 75 ?Referring Provider: Jamison Oka MD ?Height (inches): 72 ?Interpreting Physician: Jetty Duhamel MD, ABSM ?Weight (lbs): 239 ?RPSGT: Elaina Pattee ?BMI: 32 ?MRN: [id number] ?Neck Size: 17.00 ? ?CLINICAL INFORMATION ?Sleep Study Type: HST ?Indication for sleep study: Snoring ?Epworth Sleepiness Score: 3 ? ?SLEEP STUDY TECHNIQUE ?A multi-channel overnight portable sleep study was performed. The channels recorded were: nasal airflow, thoracic respiratory movement, and oxygen saturation with a pulse oximetry. Snoring was also monitored. ? ?MEDICATIONS ?Patient self administered medications include: none reported. ? ?SLEEP ARCHITECTURE ?Patient was studied for 368 minutes. The sleep efficiency was 100.0 % and the patient was supine for 69.8%. The arousal index was 0.0 per hour. ? ?RESPIRATORY PARAMETERS ?The overall AHI was 24.8 per hour, with a central apnea index of 0 per hour. ?The oxygen nadir was 83% during sleep. ? ?CARDIAC DATA ?Mean heart rate during sleep was 66.9 bpm. ? ?IMPRESSIONS ?- Moderate obstructive sleep apnea occurred during this study (AHI = 24.8/h). ?- Moderate oxygen desaturation was noted during this study (Min O2 = 83%). ?- Patient snored. ? ?DIAGNOSIS ?- Obstructive Sleep Apnea (G47.33) ? ?RECOMMENDATIONS ?- Suggest CPAP titration sleep study or autopap. Other options would be based on clinical judgment. ?- Be careful with alcohol, sedatives and other CNS depressants that may worsen sleep apnea and disrupt normal sleep architecture. ?- Sleep hygiene should be reviewed to assess factors that may improve sleep quality. ?- Weight management and regular exercise should be initiated or continued. ? ?[Electronically signed] 02/01/2022 10:46 AM ? ?Jetty Duhamel MD, ABSM ?Diplomate, Biomedical engineer of Sleep Medicine ?NPI: [id number] ? ? ?  ? ? ? ? ? ? ? ? ? ? ? ? ? ? ? ? ? ? ? ? ? ?Bryceson Grape ?Diplomate, Biomedical engineer of Sleep Medicine ? ?ELECTRONICALLY SIGNED ON:  02/01/2022, 10:44 AM ?[facility] ?PH: [phone]   FX: [phone] ?ACCREDITED BY THE AMERICAN AC[employer]EMY OF SLEEP MEDICINE ?

## 2022-02-05 ENCOUNTER — Ambulatory Visit (HOSPITAL_COMMUNITY): Payer: Managed Care, Other (non HMO)

## 2022-02-12 ENCOUNTER — Other Ambulatory Visit: Payer: Self-pay | Admitting: Internal Medicine

## 2022-02-12 DIAGNOSIS — E278 Other specified disorders of adrenal gland: Secondary | ICD-10-CM

## 2022-02-12 DIAGNOSIS — E049 Nontoxic goiter, unspecified: Secondary | ICD-10-CM

## 2022-02-14 ENCOUNTER — Ambulatory Visit
Admission: RE | Admit: 2022-02-14 | Discharge: 2022-02-14 | Disposition: A | Discharge status: Home / Self Care | Payer: Managed Care, Other (non HMO) | Source: Ambulatory Visit | Attending: Internal Medicine | Admitting: Internal Medicine

## 2022-02-14 DIAGNOSIS — E049 Nontoxic goiter, unspecified: Secondary | ICD-10-CM

## 2022-02-14 DIAGNOSIS — E278 Other specified disorders of adrenal gland: Secondary | ICD-10-CM

## 2022-02-19 ENCOUNTER — Ambulatory Visit (HOSPITAL_COMMUNITY): Payer: Managed Care, Other (non HMO)

## 2022-02-19 ENCOUNTER — Encounter (HOSPITAL_COMMUNITY): Payer: Self-pay

## 2022-05-09 ENCOUNTER — Ambulatory Visit
Admission: EM | Admit: 2022-05-09 | Discharge: 2022-05-09 | Disposition: A | Discharge status: Home / Self Care | Payer: Managed Care, Other (non HMO) | Attending: Internal Medicine | Admitting: Internal Medicine

## 2022-05-09 DIAGNOSIS — J029 Acute pharyngitis, unspecified: Secondary | ICD-10-CM | POA: Diagnosis present

## 2022-05-09 LAB — POCT RAPID STREP A (OFFICE): Rapid Strep A Screen: NEGATIVE

## 2022-05-09 MED ORDER — LIDOCAINE VISCOUS HCL 2 % MT SOLN: 15.0000 mL | Freq: Four times a day (QID) | OROMUCOSAL | 0 refills | Status: AC | PRN

## 2022-05-09 NOTE — ED Provider Notes (Signed)
[facility]    CSN: [id number] Arrival date & time: 05/09/22  1332      History   Chief Complaint Chief Complaint  Patient presents with   Sore Throat    HPI Clinton Walter is a 51 y.o. male comes to the urgent care with a 2-day history of sore throat.  Symptoms started insidiously and has been persistent.  Patient endorses a low-grade fever.  No shortness of breath or wheezing.  No sick contacts.  No nausea or vomiting.  Patient has pain with swallowing.  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  He did not have any complications with anesthesia postoperatively.  No sick contacts.  Patient is not vaccinated against COVID-19 virus. HPI  Past Medical History:  Diagnosis Date   Hypertension     There are no problems to display for this patient.   Past Surgical History:  Procedure Laterality Date   APPENDECTOMY         Home Medications    Prior to Admission medications   Medication Sig Start Date End Date Taking? Authorizing Provider  magic mouthwash (lidocaine, diphenhydrAMINE, alum & mag hydroxide) suspension Swish and swallow 15 mLs 4 (four) times daily as needed for mouth pain. Compounding formula: Lidocaine viscus 2% - 80 mL, Maalox-80 mL, diphenhydramine 12.5 mg / 5 mL-80 mL 05/09/22  Yes Vandella Ord, Britta Mccreedy, MD  lisinopril (PRINIVIL,ZESTRIL) 20 MG tablet Take 1 tablet (20 mg total) by mouth daily. 12/27/14   Linna Hoff, MD  methocarbamol (ROBAXIN-750) 750 MG tablet Take 1 tablet (750 mg total) by mouth every 6 (six) hours as needed for muscle spasms. 11/15/21   Arby Barrette, MD  methylPREDNISolone (MEDROL DOSEPAK) 4 MG TBPK tablet Take per Dosepak instruction 11/15/21   Arby Barrette, MD  polyethylene glycol (MIRALAX) 17 g packet Take 17 g by mouth daily. 03/18/20   Belinda Fisher, PA-C    Family History Family History  Family history unknown: Yes    Social History Social History   Tobacco Use   Smoking status: Every Day    Smokeless tobacco: Never  Substance Use Topics   Alcohol use: Yes   Drug use: Yes    Types: Marijuana     Allergies   Amlodipine   Review of Systems Review of Systems  HENT:  Positive for sore throat. Negative for congestion and dental problem.   Respiratory:  Positive for cough. Negative for shortness of breath and wheezing.      Physical Exam Triage Vital Signs ED Triage Vitals  Enc Vitals Group     BP 05/09/22 1428 (!) 147/79     Pulse Rate 05/09/22 1428 (!) 101     Resp 05/09/22 1428 17     Temp 05/09/22 1428 100 F (37.8 C)     Temp Source 05/09/22 1428 Oral     SpO2 05/09/22 1428 95 %     Weight --      Height --      Head Circumference --      Peak Flow --      Pain Score 05/09/22 1431 8     Pain Loc --      Pain Edu? --      Excl. in GC? --    No data found.  Updated Vital Signs BP (!) 147/79 (BP Location: Left Arm)   Pulse (!) 101   Temp 100 F (37.8 C) (Oral)   Resp 17   SpO2 95%  Visual Acuity Right Eye Distance:   Left Eye Distance:   Bilateral Distance:    Right Eye Near:   Left Eye Near:    Bilateral Near:     Physical Exam Vitals and nursing note reviewed.  Constitutional:      General: He is not in acute distress.    Appearance: He is not ill-appearing.  HENT:     Right Ear: Tympanic membrane normal.     Left Ear: Tympanic membrane normal.     Mouth/Throat:     Mouth: Mucous membranes are moist.     Pharynx: Uvula midline. Posterior oropharyngeal erythema present.     Tonsils: No tonsillar exudate or tonsillar abscesses. 0 on the right. 0 on the left.  Cardiovascular:     Rate and Rhythm: Normal rate and regular rhythm.  Neurological:     Mental Status: He is alert.      UC Treatments / Results  Labs (all labs ordered are listed, but only abnormal results are displayed) Labs Reviewed  CULTURE, GROUP A STREP [facility])  POCT RAPID STREP A (OFFICE)    EKG   Radiology No results found.  Procedures Procedures  (including critical care time)  Medications Ordered in UC Medications - No data to display  Initial Impression / Assessment and Plan / UC Course  I have reviewed the triage vital signs and the nursing notes.  Pertinent labs & imaging results that were available during my care of the patient were reviewed by me and considered in my medical decision making (see chart for details).     1.  Acute viral pharyngitis: Point-of-care strep is negative Throat cultures have been sent Chloraseptic throat spray Magic mouthwash to gargle and swallow We will call patient with recommendations if labs are abnormal Continue ibuprofen use for fever and/or pain Return precautions given. Final Clinical Impressions(s) / UC Diagnoses   Final diagnoses:  Acute viral pharyngitis     Discharge Instructions      Warm salt water gargle Chloraseptic throat spray Strep test is negative Ibuprofen as needed for pain Maintain adequate hydration Return to urgent care if symptoms persist or worsens.   ED Prescriptions     Medication Sig Dispense Auth. Provider   magic mouthwash (lidocaine, diphenhydrAMINE, alum & mag hydroxide) suspension Swish and swallow 15 mLs 4 (four) times daily as needed for mouth pain. Compounding formula: Lidocaine viscus 2% - 80 mL, Maalox-80 mL, diphenhydramine 12.5 mg / 5 mL-80 mL 240 mL Dorcus Riga, Britta Mccreedy, MD      PDMP not reviewed this encounter.   Merrilee Jansky, MD 05/09/22 [phone]

## 2022-05-09 NOTE — ED Triage Notes (Signed)
Pt presents with sore throat X 2 days. 

## 2022-05-09 NOTE — Discharge Instructions (Addendum)
Warm salt water gargle Chloraseptic throat spray Strep test is negative Ibuprofen as needed for pain Maintain adequate hydration Return to urgent care if symptoms persist or worsens.

## 2022-05-09 NOTE — ED Triage Notes (Signed)
Pt states he took Nyquil and blood pressure medication at noon

## 2022-05-14 LAB — CULTURE, GROUP A STREP (THRC)

## 2022-12-21 IMAGING — CT CT CHEST W/O CM
1 of 2 series · 14 of 31 positions shown, 18 images · non-contrast
Comparison: Chest x-ray 11/15/2021.

CLINICAL DATA: Solitary pulmonary nodule in the right apex.



[Series 6: super d · axial · 0.93mm/px · z∈[-361,-44]mm · 14 of 444 slices shown, 18 images]
[im 24/444  mediastinal]
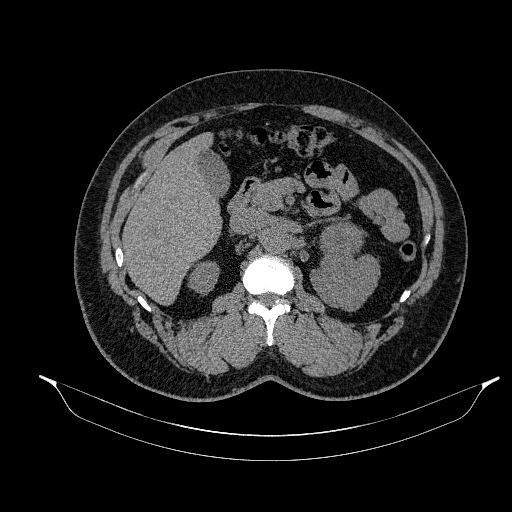
[im 24/444  lung]
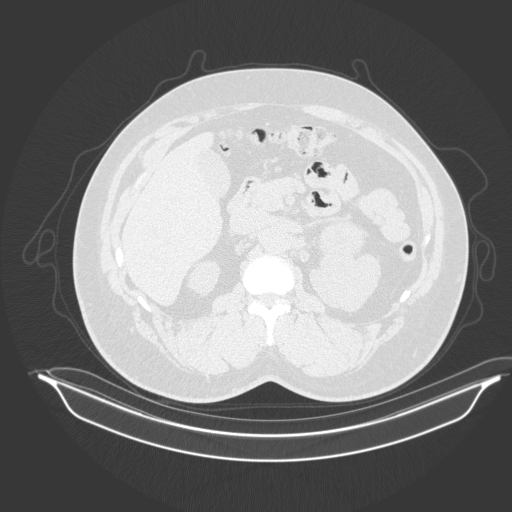
[im 70/444  lung]
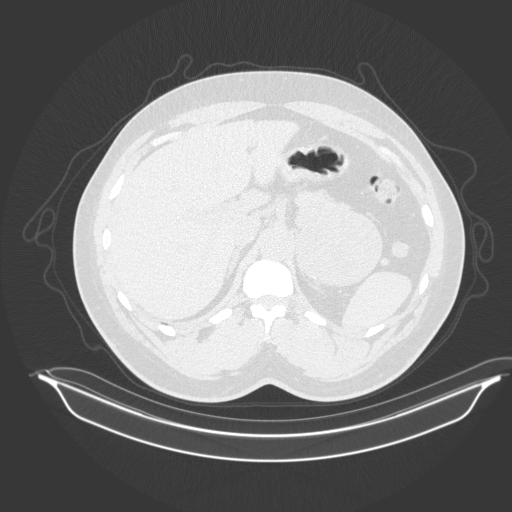
[im 94/444  lung]
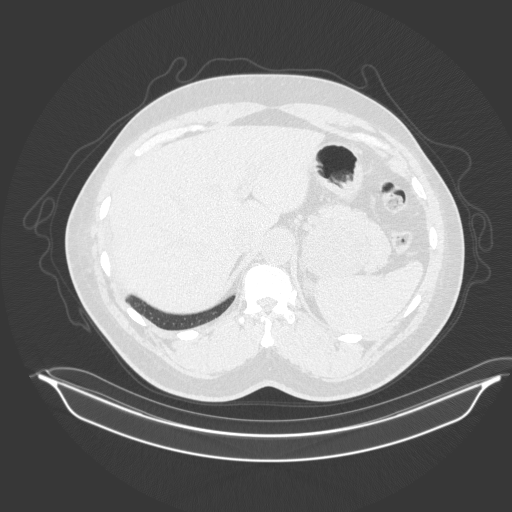
[im 140/444  lung]
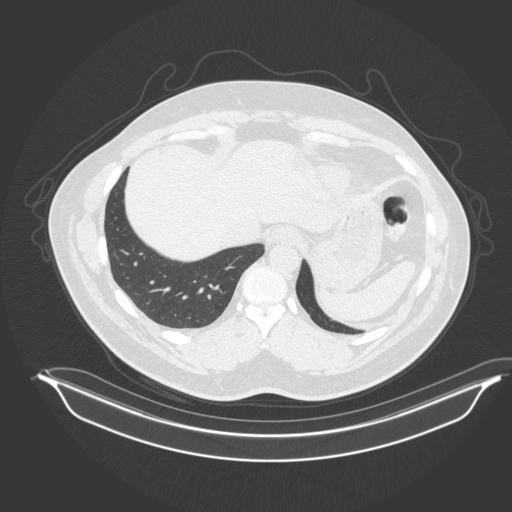
[im 148/444  mediastinal]
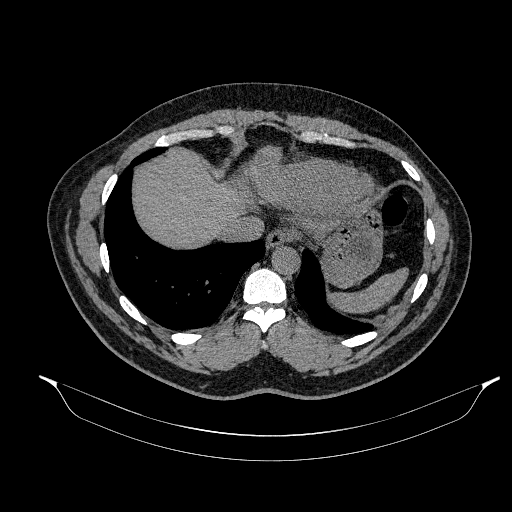
[im 148/444  lung]
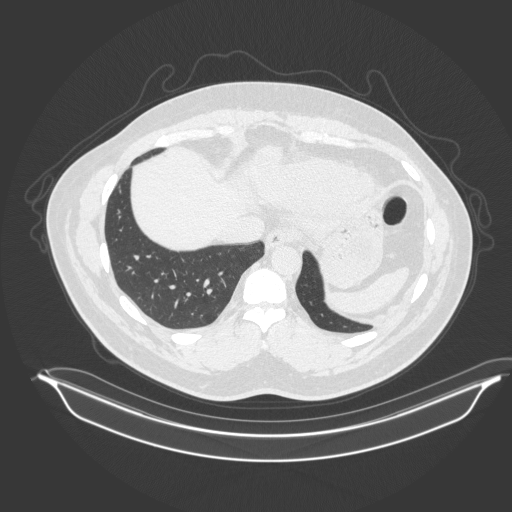
[im 164/444  lung]
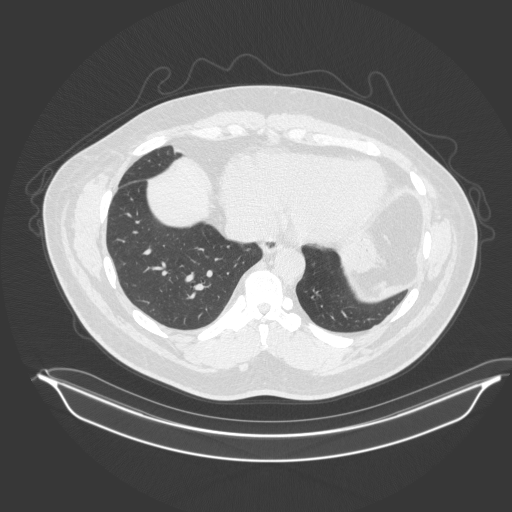
[im 210/444  lung]
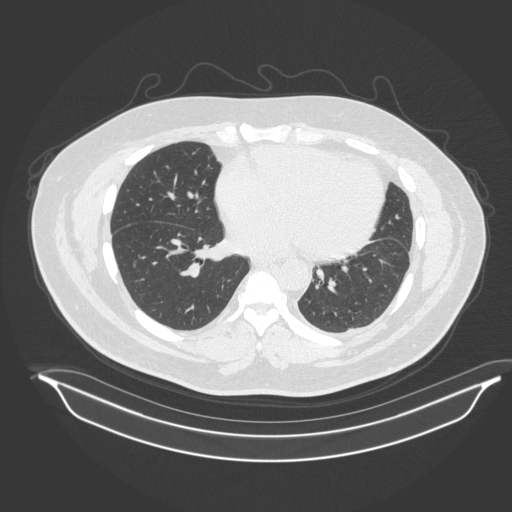
[im 234/444  lung]
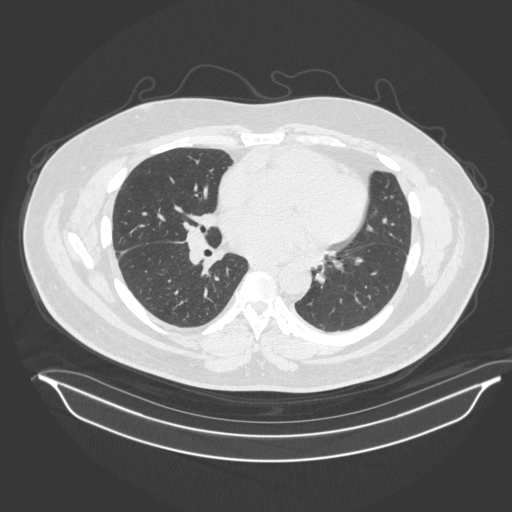
[im 280/444  mediastinal]
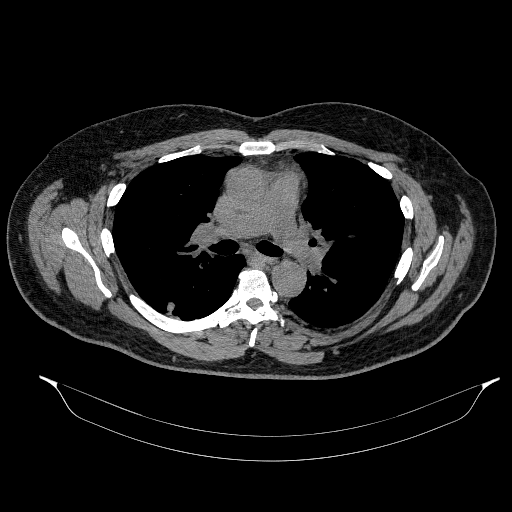
[im 280/444  lung]
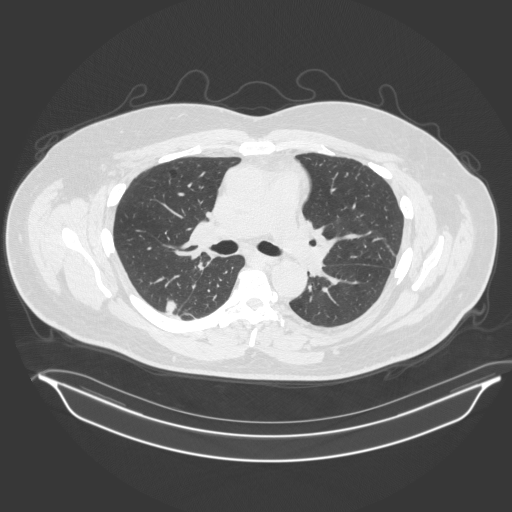
[im 296/444  lung]
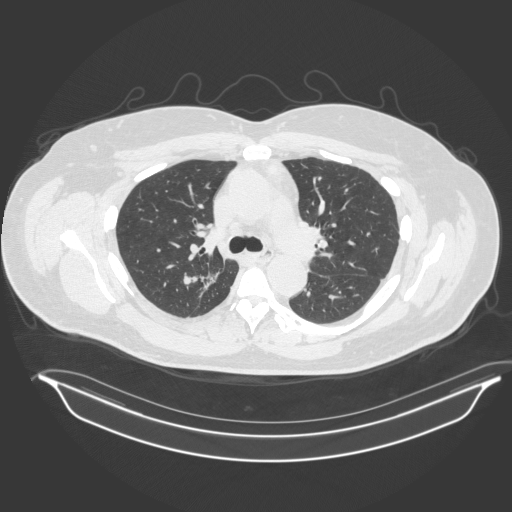
[im 327/444  lung]
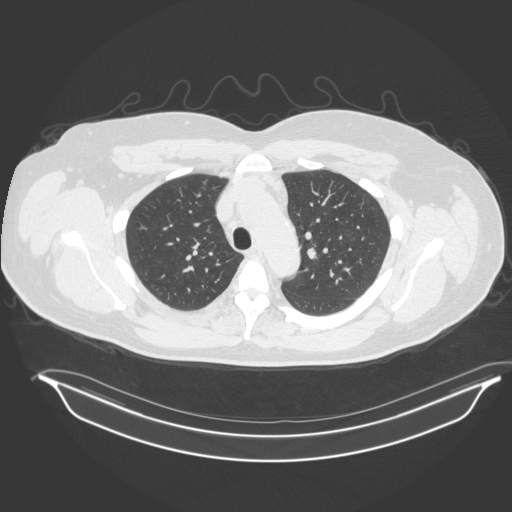
[im 350/444  lung]
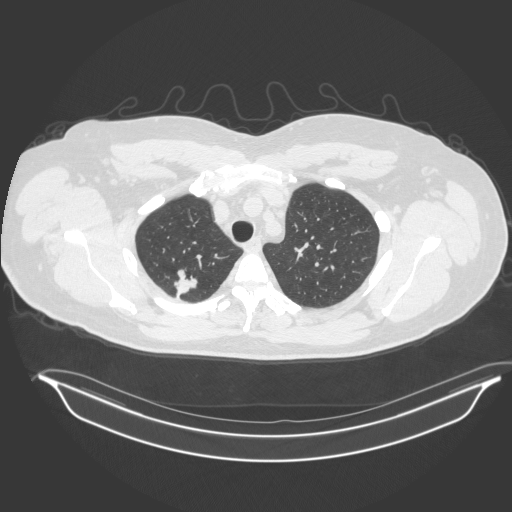
[im 374/444  mediastinal]
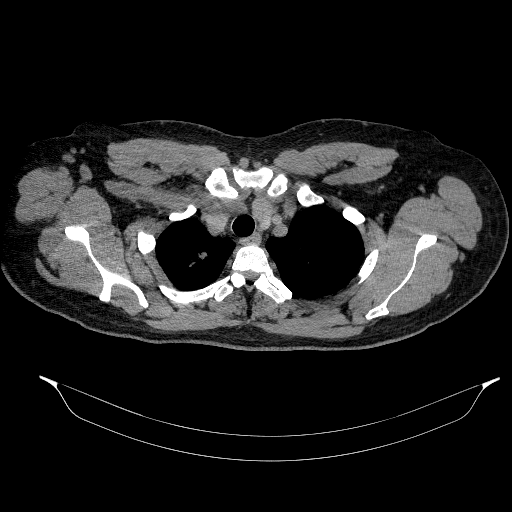
[im 374/444  lung]
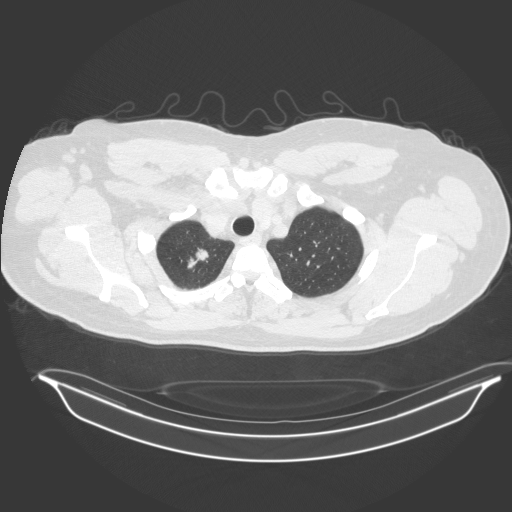
[im 420/444  lung]
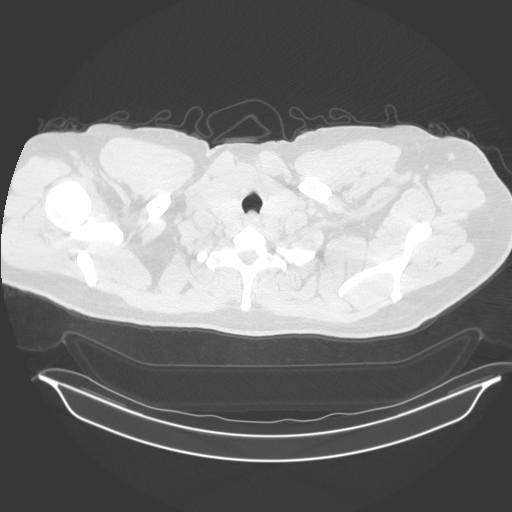

[14 of 31 positions shown; findings below may reference images not displayed]

FINDINGS: Cardiovascular: Heart is mildly enlarged. There is no pericardial
effusion. Aorta is normal in size.

Mediastinum/Nodes: Visualized thyroid gland is within normal limits.
There are numerous nonenlarged mediastinal lymph nodes diffusely. No
definite enlarged lymph nodes are identified allowing for lack of
intravenous contrast. There are likely prominent or mildly enlarged
right hilar lymph nodes. The esophagus is nondilated.

Lungs/Pleura: There are clusters of nodular densities in the left
lung apex. The largest nodule measures 2 cm image 5/36. There are
additional nodular densities in the inferior right upper lobe
measuring 6 mm. There also nodular densities seen within the
superior segment of the left lower lobe with the largest nodule
measuring 1.9 by 1.5 cm image 5/71. There is an inferior right lower
lobe nodule measuring 11 mm image 5/97. There is a small cluster of
nodules in the left lower lobe with the largest nodule measuring up
to 11 mm image 5/76. There is no pleural effusion or pneumothorax.
Trachea and central airways appear within normal limits.

Upper Abdomen: There is a rounded mass measuring 7.8 by 7.0 by
cm in the region of the left adrenal gland. This mass abuts the
adrenal gland, pancreatic tail and superior pole of the kidney.
Exact origin is uncertain. There is an additional 18 mm
indeterminate left adrenal nodule.

Musculoskeletal: No chest wall mass or suspicious bone lesions
identified.
IMPRESSION: 1. Multiple nodular densities, many of which are clustered, in the
right upper lobe, right lower lobe and minimally in the left lower
lobe. These measure up to 1.9 cm. Findings may represent malignancy
or infectious process. Please correlate clinically. Consider a
non-contrast Chest CT at 3 months, a PET/CT, or tissue sampling.
These guidelines do not apply to immunocompromised patients and
patients with cancer. Follow up in patients with significant
comorbidities as clinically warranted. For lung cancer screening,
adhere to Lung-RADS guidelines. Reference: Radiology. 4209;
284(1):228-43.
2. Indeterminate mass in the region of the left adrenal gland
measuring 7.8 cm. Favored as adrenal in origin, but this mass abuts
the pancreatic tail and left kidney. Malignancy not excluded.
Recommend further evaluation with MRI.
3. Additional indeterminate left adrenal and left renal lesions.

## 2023-01-20 IMAGING — US US THYROID
1 series · 13 of 25 positions shown · non-contrast
Comparison: None.

CLINICAL DATA: Nontoxic goiter

EXAM:
THYROID ULTRASOUND
TECHNIQUE: Ultrasound examination of the thyroid gland and adjacent soft
tissues was performed.

[Series 1: us thyroid · 0.06mm/px · 13 of 115 slices shown]
[im 1/115]
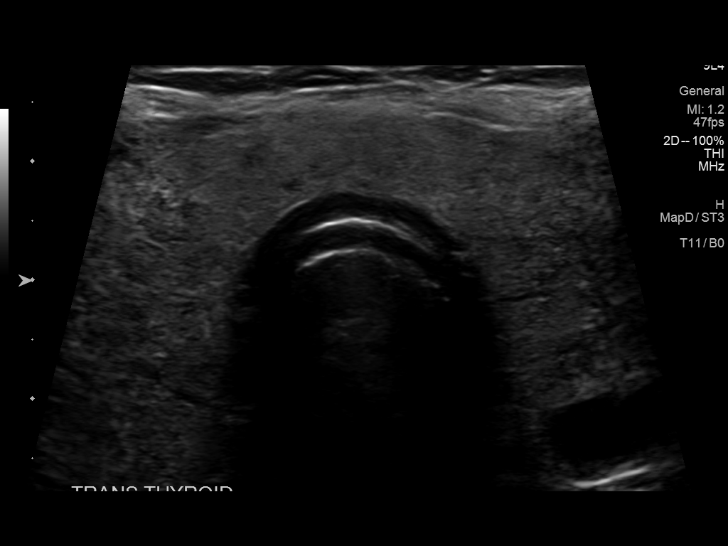
[im 10/115]
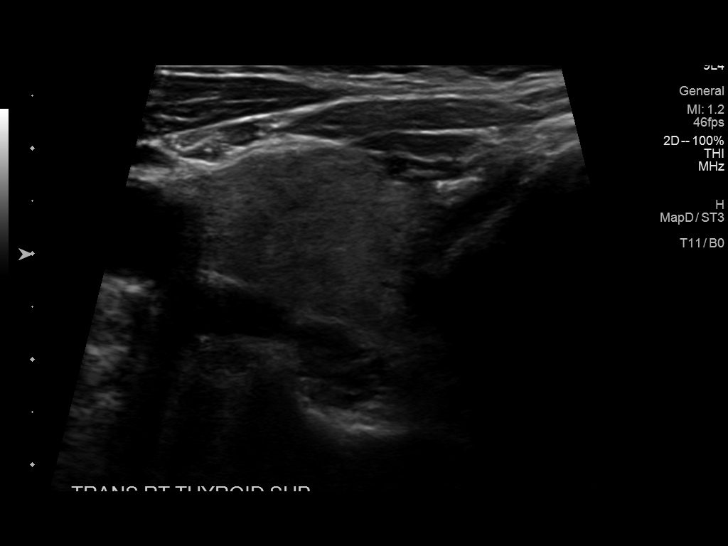
[im 20/115]
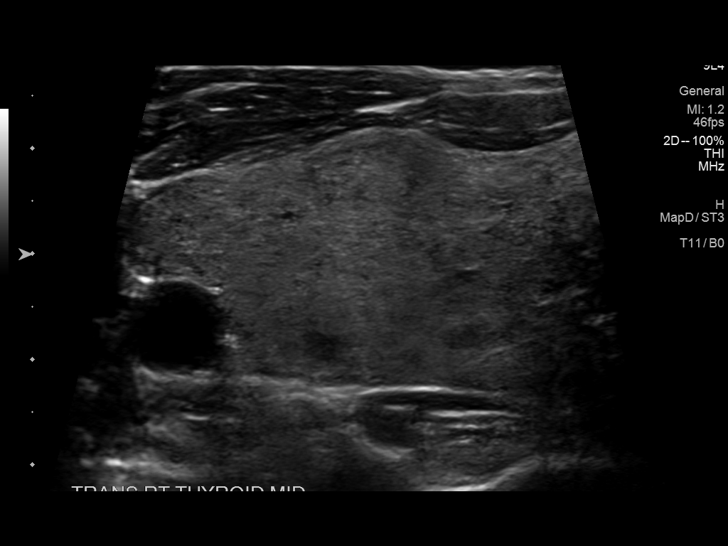
[im 29/115]
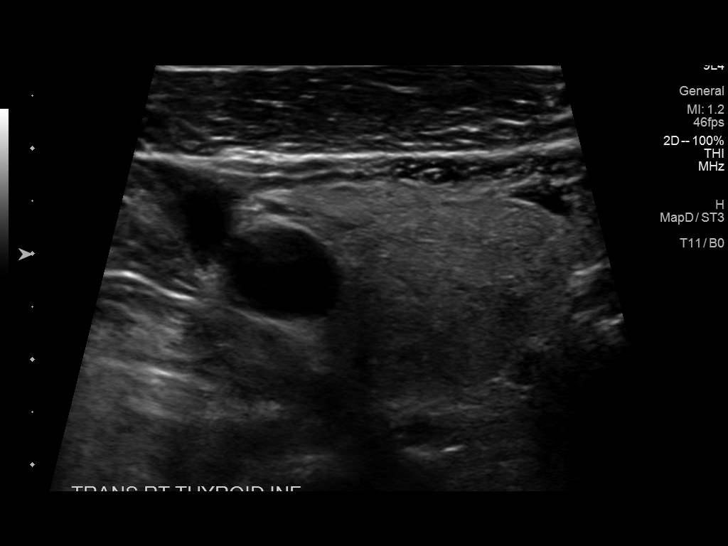
[im 39/115]
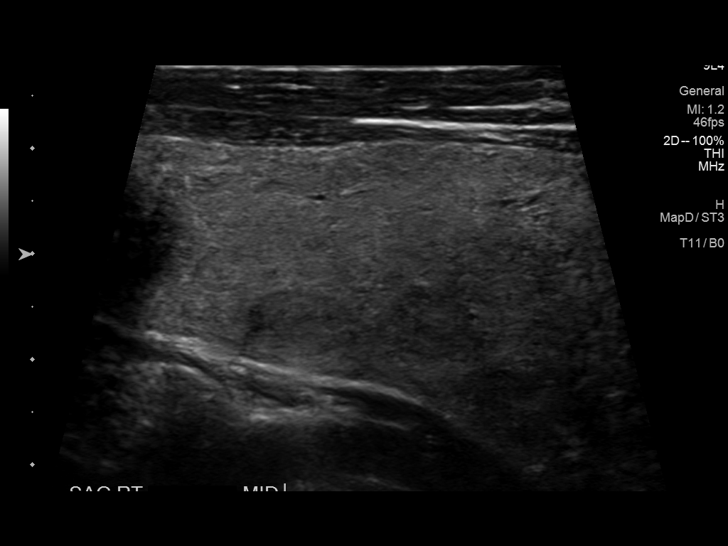
[im 48/115]
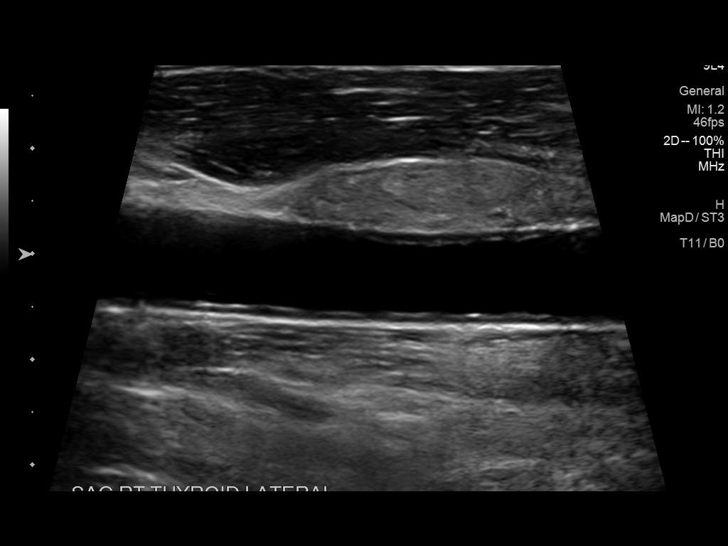
[im 58/115]
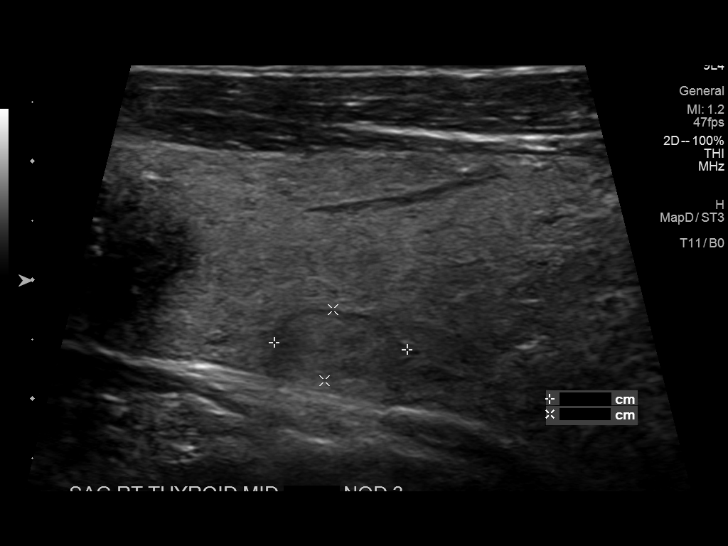
[im 67/115]
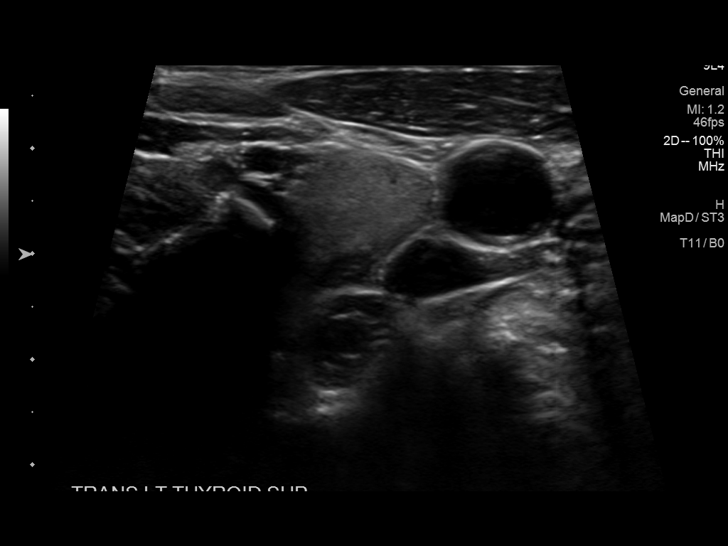
[im 77/115]
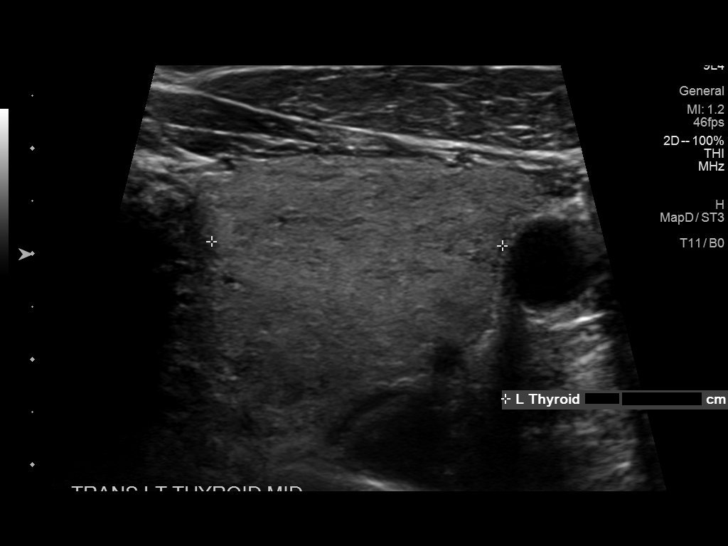
[im 86/115]
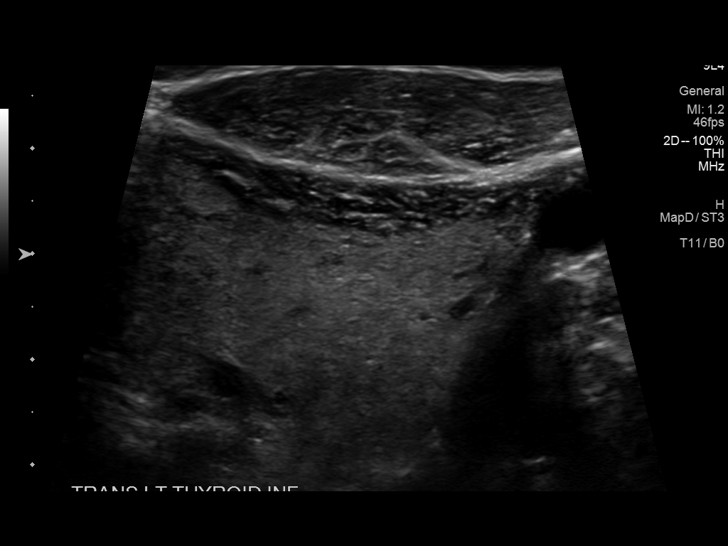
[im 96/115]
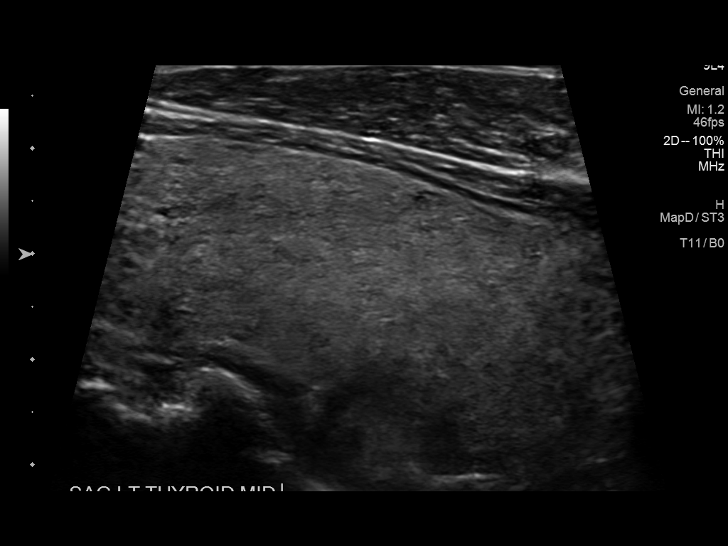
[im 105/115]
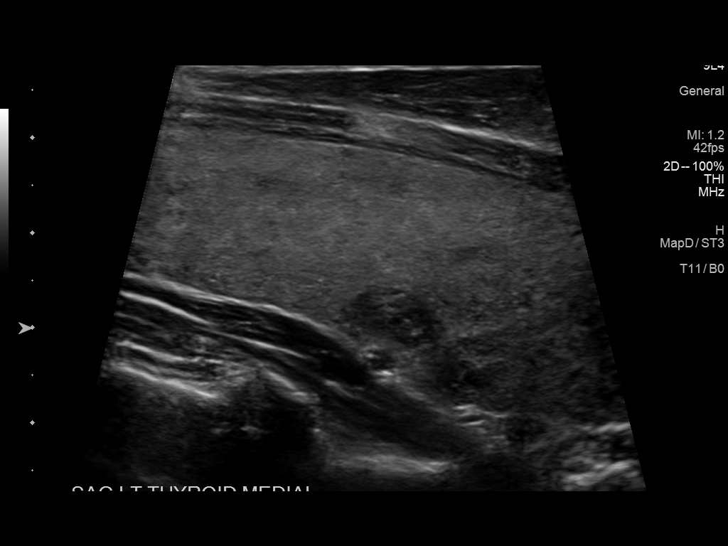
[im 115/115]
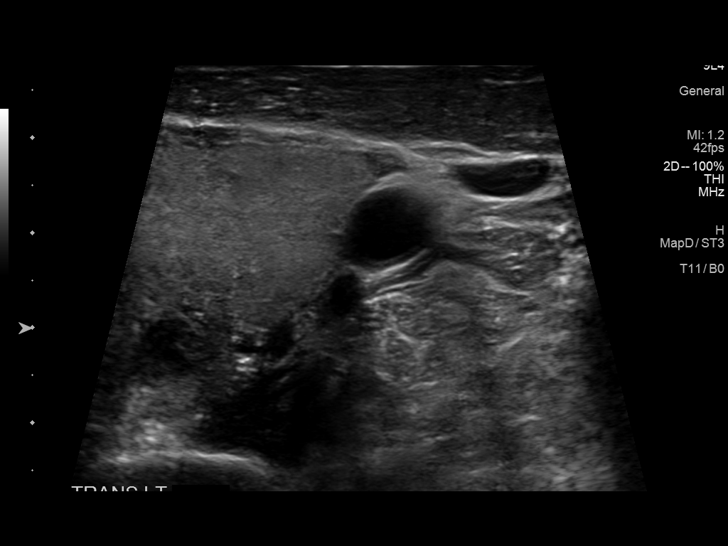

[13 of 25 positions shown; findings below may reference images not displayed]

FINDINGS: Parenchymal Echotexture: Moderately heterogenous, hyperemic

Isthmus: 0.7 cm thickness

Right lobe: 9.8 x 2.5 x 4.3 cm

Left lobe: 8.9 x 2.9 x 2.8 cm

_________________________________________________________

Estimated total number of nodules >/= 1 cm: 4

Number of spongiform nodules >/=  2 cm not described below (TR1): 0

Number of mixed cystic and solid nodules >/= 1.5 cm not described
below (TR2): 0

_________________________________________________________

Nodule # 1:

Location: Right; superior

Maximum size: 1.5 cm; Other 2 dimensions: 1 x 1.3 cm

Composition: solid/almost completely solid (2)

Echogenicity: very hypoechoic (3)

Shape: not taller-than-wide (0)

Margins: ill-defined (0)

Echogenic foci: macrocalcifications (1)

ACR TI-RADS total points: 6.

ACR TI-RADS risk category: TR 4.

ACR TI-RADS recommendations:

**Given size (>/= 1.5 cm) and appearance, fine needle aspiration of
this moderately suspicious nodule should be considered based on
TI-RADS criteria.

_________________________________________________________

Nodule # 2: 1.3 cm isoechoic nodule without calcifications, mid
right; This nodule does NOT meet TI-RADS criteria for biopsy or
dedicated follow-up.

Nodule # 3:

Location: Right; posterior

Maximum size: 1.1 cm; Other 2 dimensions: 0.6 x 0.8 cm

Composition: solid/almost completely solid (2)

Echogenicity: hypoechoic (2)

Shape: not taller-than-wide (0)

Margins: ill-defined (0)

Echogenic foci: none (0)

ACR TI-RADS total points: 4.

ACR TI-RADS risk category: TR 4.

ACR TI-RADS recommendations:

*Given size (>/= 1 - 1.4 cm) and appearance, a follow-up ultrasound
in 1 year should be considered based on TI-RADS criteria.

_________________________________________________________

Nodule # 4: 0.8 cm hypoechoic nodule without calcifications,
inferior right; This nodule does NOT meet TI-RADS criteria for
biopsy or dedicated follow-up.

Nodule # 5: 1.1 cm complex cyst, inferior left; This nodule does NOT
meet TI-RADS criteria for biopsy or dedicated follow-up.

No regional cervical adenopathy is identified.
IMPRESSION: 1. Thyromegaly with bilateral nodules.
2. Recommend FNA biopsy of moderately suspicious 1.5 cm superior
right nodule.
3. Recommend annual/biennial ultrasound follow-up of nodules as
above, until stability x5 years confirmed.

The above is in keeping with the ACR TI-RADS recommendations - [HOSPITAL] 2508;[DATE].
